# Patient Record
Sex: Male | Born: 1970 | Race: Black or African American | Hispanic: No | Marital: Married | Smoking: Current every day smoker
Health system: Southern US, Community
[De-identification: ages and names within clinical notes are randomized; demographics above are authoritative.]

## PROBLEM LIST (undated history)

## (undated) DIAGNOSIS — C6290 Malignant neoplasm of unspecified testis, unspecified whether descended or undescended: Secondary | ICD-10-CM

## (undated) HISTORY — PX: SURGERY SCROTAL / TESTICULAR: SUR1316

---

## 1998-03-08 ENCOUNTER — Ambulatory Visit (HOSPITAL_COMMUNITY): Admission: RE | Admit: 1998-03-08 | Discharge: 1998-03-08 | Payer: Self-pay | Admitting: Urology

## 2010-02-18 ENCOUNTER — Emergency Department (HOSPITAL_BASED_OUTPATIENT_CLINIC_OR_DEPARTMENT_OTHER): Admission: EM | Admit: 2010-02-18 | Discharge: 2010-02-18 | Payer: Self-pay | Admitting: Emergency Medicine

## 2013-03-17 ENCOUNTER — Emergency Department (HOSPITAL_BASED_OUTPATIENT_CLINIC_OR_DEPARTMENT_OTHER)
Admission: EM | Admit: 2013-03-17 | Discharge: 2013-03-17 | Disposition: A | Discharge status: Home / Self Care | Payer: 59 | Attending: Emergency Medicine | Admitting: Emergency Medicine

## 2013-03-17 ENCOUNTER — Emergency Department (HOSPITAL_BASED_OUTPATIENT_CLINIC_OR_DEPARTMENT_OTHER): Payer: 59

## 2013-03-17 ENCOUNTER — Encounter (HOSPITAL_BASED_OUTPATIENT_CLINIC_OR_DEPARTMENT_OTHER): Payer: Self-pay | Admitting: *Deleted

## 2013-03-17 DIAGNOSIS — R109 Unspecified abdominal pain: Secondary | ICD-10-CM

## 2013-03-17 DIAGNOSIS — Z8547 Personal history of malignant neoplasm of testis: Secondary | ICD-10-CM | POA: Insufficient documentation

## 2013-03-17 DIAGNOSIS — R1032 Left lower quadrant pain: Secondary | ICD-10-CM | POA: Insufficient documentation

## 2013-03-17 DIAGNOSIS — F17200 Nicotine dependence, unspecified, uncomplicated: Secondary | ICD-10-CM | POA: Insufficient documentation

## 2013-03-17 HISTORY — DX: Malignant neoplasm of unspecified testis, unspecified whether descended or undescended: C62.90

## 2013-03-17 LAB — COMPREHENSIVE METABOLIC PANEL
BUN: 10 mg/dL (ref 6–23)
CO2: 26 mEq/L (ref 19–32)
Calcium: 9.8 mg/dL (ref 8.4–10.5)
Creatinine, Ser: 1.2 mg/dL (ref 0.50–1.35)
GFR calc Af Amer: 86 mL/min — ABNORMAL LOW (ref >90)
GFR calc non Af Amer: 74 mL/min — ABNORMAL LOW (ref >90)
Glucose, Bld: 92 mg/dL (ref 70–99)

## 2013-03-17 LAB — CBC WITH DIFFERENTIAL/PLATELET
Eosinophils Relative: 2 % (ref 0–5)
HCT: 43.6 % (ref 39.0–52.0)
Lymphocytes Relative: 23 % (ref 12–46)
Lymphs Abs: 2 10*3/uL (ref 0.7–4.0)
MCV: 92.4 fL (ref 78.0–100.0)
Monocytes Absolute: 0.7 10*3/uL (ref 0.1–1.0)
RBC: 4.72 MIL/uL (ref 4.22–5.81)
RDW: 13.9 % (ref 11.5–15.5)
WBC: 8.5 10*3/uL (ref 4.0–10.5)

## 2013-03-17 LAB — URINALYSIS, ROUTINE W REFLEX MICROSCOPIC
Leukocytes, UA: NEGATIVE
Protein, ur: NEGATIVE mg/dL
Urobilinogen, UA: 0.2 mg/dL (ref 0.0–1.0)

## 2013-03-17 MED ORDER — IOHEXOL 300 MG/ML  SOLN
100.0000 mL | Freq: Once | INTRAMUSCULAR | Status: AC | PRN
  Administered 2013-03-17: 100 mL via INTRAVENOUS

## 2013-03-17 MED ORDER — IOHEXOL 300 MG/ML  SOLN
50.0000 mL | Freq: Once | INTRAMUSCULAR | Status: AC | PRN
  Administered 2013-03-17: 50 mL via INTRAVENOUS

## 2013-03-17 MED ORDER — SODIUM CHLORIDE 0.9 % IV SOLN
1000.0000 mL | Freq: Once | INTRAVENOUS | Status: AC
  Administered 2013-03-17: 1000 mL via INTRAVENOUS

## 2013-03-17 MED ORDER — ONDANSETRON HCL 4 MG/2ML IJ SOLN
4.0000 mg | Freq: Once | INTRAMUSCULAR | Status: AC
  Administered 2013-03-17: 4 mg via INTRAVENOUS
  Filled 2013-03-17: qty 2

## 2013-03-17 MED ORDER — SENNOSIDES-DOCUSATE SODIUM 8.6-50 MG PO TABS: 2.0000 | ORAL_TABLET | Freq: Every day | ORAL | Status: AC

## 2013-03-17 NOTE — ED Notes (Signed)
Pt reports also pains off and on "sharp" to his left lower quadrant.

## 2013-03-17 NOTE — ED Provider Notes (Signed)
History    CSN: [id number] Arrival date & time 03/17/13  4098  First MD Initiated Contact with Patient 03/17/13 [phone]     Chief Complaint  Patient presents with  . Constipation   (Consider location/radiation/quality/duration/timing/severity/associated sxs/prior Treatment) HPI  Patient presents with concern of persistent left lower quadrant, left inguinal pain.  Pain has been present consistently now for 3 days, there was low-grade prior to this time.  The pain is sore, nonradiating.  Patient has concurrent decrease in stool. He has no other abdominal pain, no vomiting, no anorexia. Patient typically has infrequent stool, but this seems more prolonged than typical for him. The patient has no history of testicular cancer. He denies history of recent persistent scrotal pain or changes. For his testicular cancer he had radiation therapy, orchectomy. Patient is otherwise generally well-appearing  Past Medical History  Diagnosis Date  . Testicular cancer    History reviewed. No pertinent past surgical history. History reviewed. No pertinent family history. History  Substance Use Topics  . Smoking status: Current Every Day Smoker  . Smokeless tobacco: Not on file  . Alcohol Use: Not on file    Review of Systems  Constitutional:       Per HPI, otherwise negative  HENT:       Per HPI, otherwise negative  Respiratory:       Per HPI, otherwise negative  Cardiovascular:       Per HPI, otherwise negative  Gastrointestinal: Negative for vomiting.  Endocrine:       Negative aside from HPI  Genitourinary:       Neg aside from HPI   Musculoskeletal:       Per HPI, otherwise negative  Skin: Negative.   Neurological: Negative for syncope.    Allergies  Review of patient's allergies indicates no known allergies.  Home Medications  No current outpatient prescriptions on file. BP 126/85  Pulse 57  Temp(Src) 97.5 F (36.4 C) (Oral)  SpO2 100% Physical Exam  Nursing note  and vitals reviewed. Constitutional: He is oriented to person, place, and time. He appears well-developed. No distress.  HENT:  Head: Normocephalic and atraumatic.  Eyes: Conjunctivae and EOM are normal.  Cardiovascular: Normal rate and regular rhythm.   Pulmonary/Chest: Effort normal. No stridor. No respiratory distress.  Abdominal: Normal appearance and bowel sounds are normal. He exhibits no distension. There is tenderness in the left lower quadrant. There is no rigidity and no guarding.  Genitourinary: Penis normal.  Musculoskeletal: He exhibits no edema.  Lymphadenopathy:       Left: Inguinal adenopathy present.  Neurological: He is alert and oriented to person, place, and time.  Skin: Skin is warm and dry.  Psychiatric: He has a normal mood and affect.    ED Course  Procedures (including critical care time) Labs Reviewed  CBC WITH DIFFERENTIAL  COMPREHENSIVE METABOLIC PANEL  LIPASE, BLOOD  URINALYSIS, ROUTINE W REFLEX MICROSCOPIC   No results found. No diagnosis found. Pulse oximetry 100% room air normal   CT results are reassuring.  Results shared with the patient.  No new complaints MDM  This patient with notable history of testicular cancer now presents with left inguinal pain and on exam has palpable adenopathy, and pain.  With the history, his new pain, CT scan was performed to rule out malignancy versus obstruction with his endorsement of bowel habit changes.  CT was reassuring.  Patient was started on stool softeners, discharged to follow up with primary care.  Gerhard Munch,  MD 03/17/13 1213

## 2013-03-17 NOTE — ED Notes (Signed)
Pt amb to room 9 with quick steady gait in nad. Pt reports intermittent constipation x 3 weeks, pt reports last bm "small" was Sunday. Pt has not taken any laxatives or stool softeners.

## 2014-08-11 IMAGING — CT CT ABD-PELV W/ CM
2 of 8 series · 12 of 46 positions shown, 14 images · IV contrast (APPLIED)
Comparison: None.

CLINICAL DATA: Left lower quadrant pain, history of testicular
carcinoma

CT ABDOMEN AND PELVIS WITH CONTRAST
TECHNIQUE: Multidetector CT imaging of the abdomen and pelvis was
performed following the standard protocol during bolus
administration of intravenous contrast.
Contrast: 50mL OMNIPAQUE IOHEXOL 300 MG/ML  SOLN, 100mL OMNIPAQUE
IOHEXOL 300 MG/ML  SOLN

[Series 2: abd/pelvis 5.0 b31f · axial · 0.81mm/px · z∈[+832,+1276]mm · 9 of 101 slices shown, 11 images]
[im 6/101  soft-tissue]
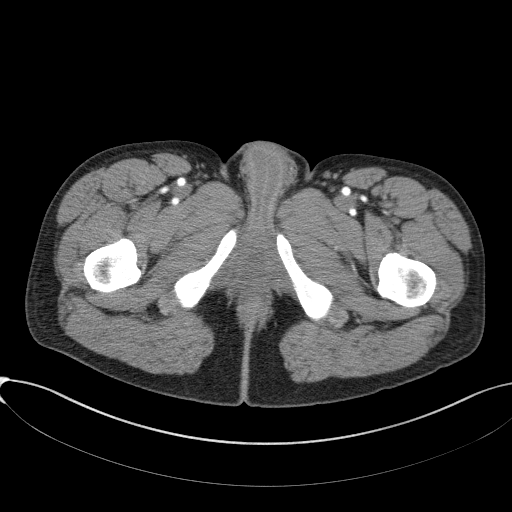
[im 6/101  bone]
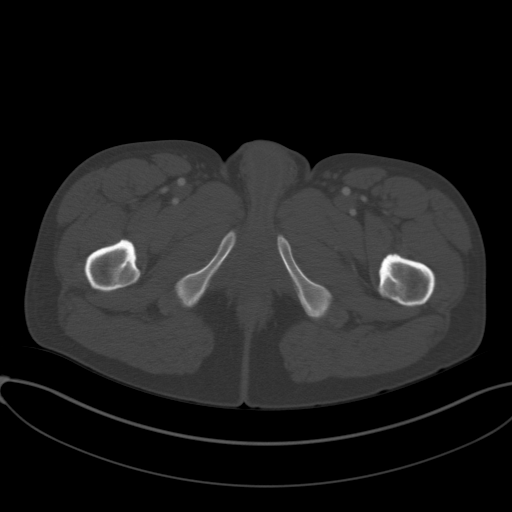
[im 18/101  soft-tissue]
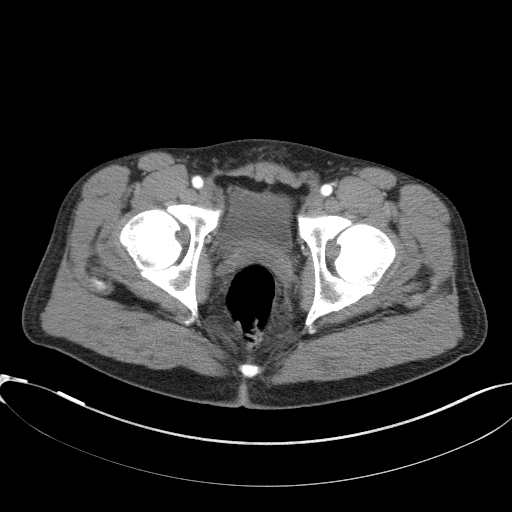
[im 30/101  soft-tissue]
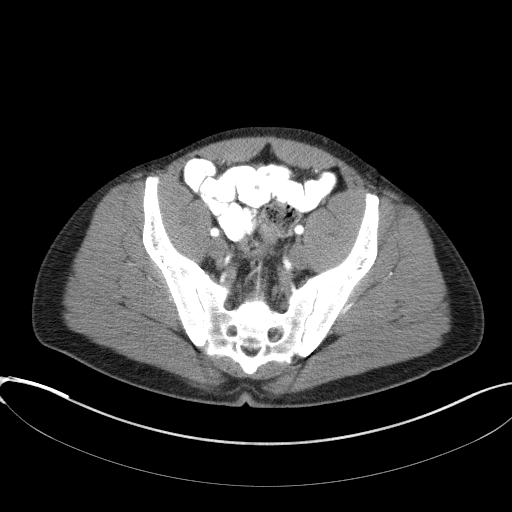
[im 42/101  soft-tissue]
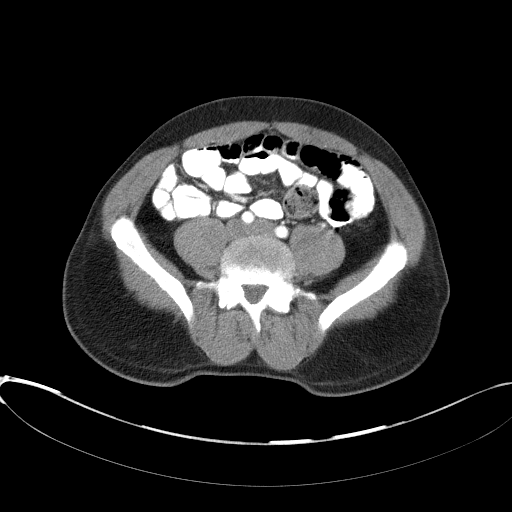
[im 53/101  soft-tissue]
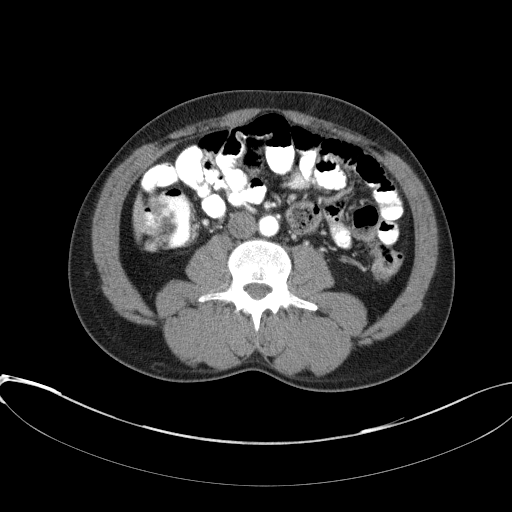
[im 59/101  soft-tissue]
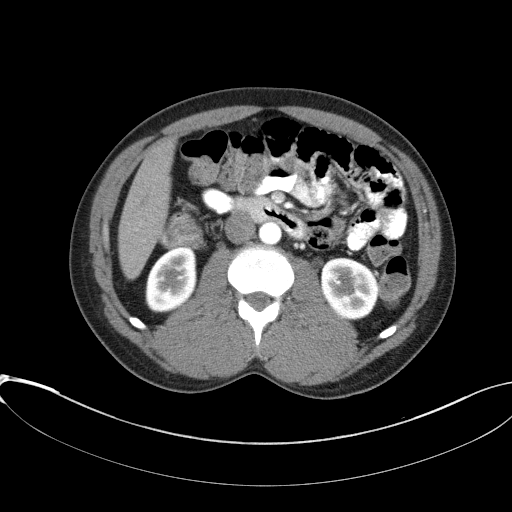
[im 71/101  soft-tissue]
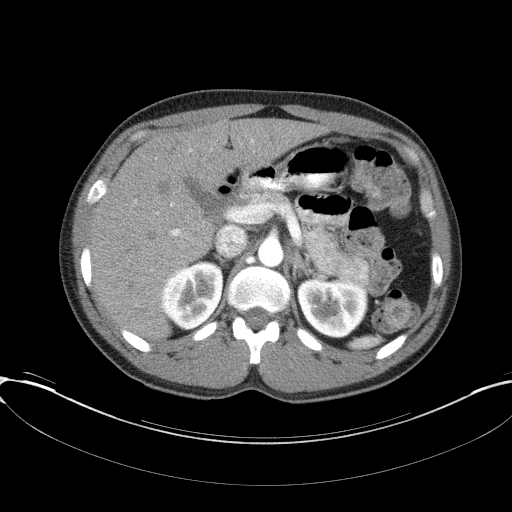
[im 83/101  soft-tissue]
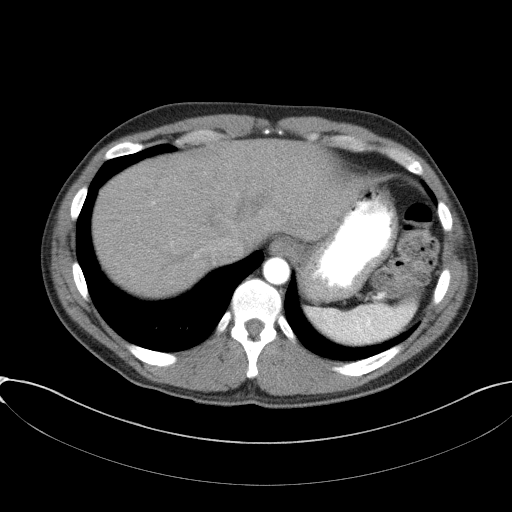
[im 95/101  soft-tissue]
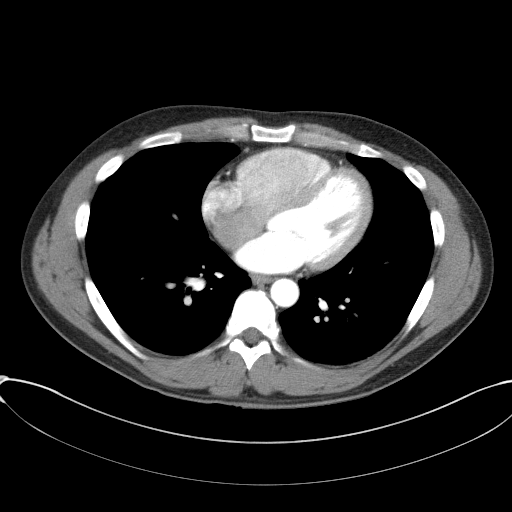
[im 95/101  bone]
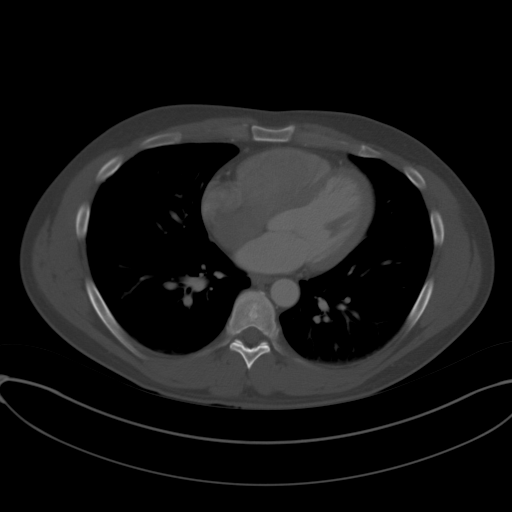

[Series 9: abd/pelvis 3.0 coronal · coronal · 0.34mm/px · 3 of 72 slices shown]
[im 18/72  soft-tissue]
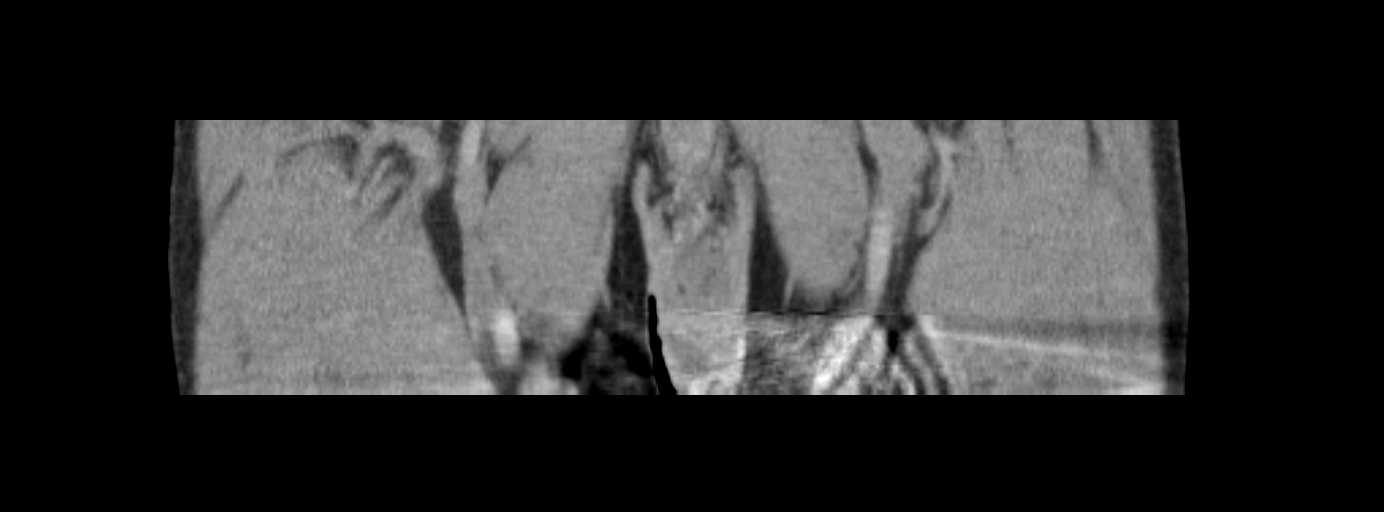
[im 36/72  soft-tissue]
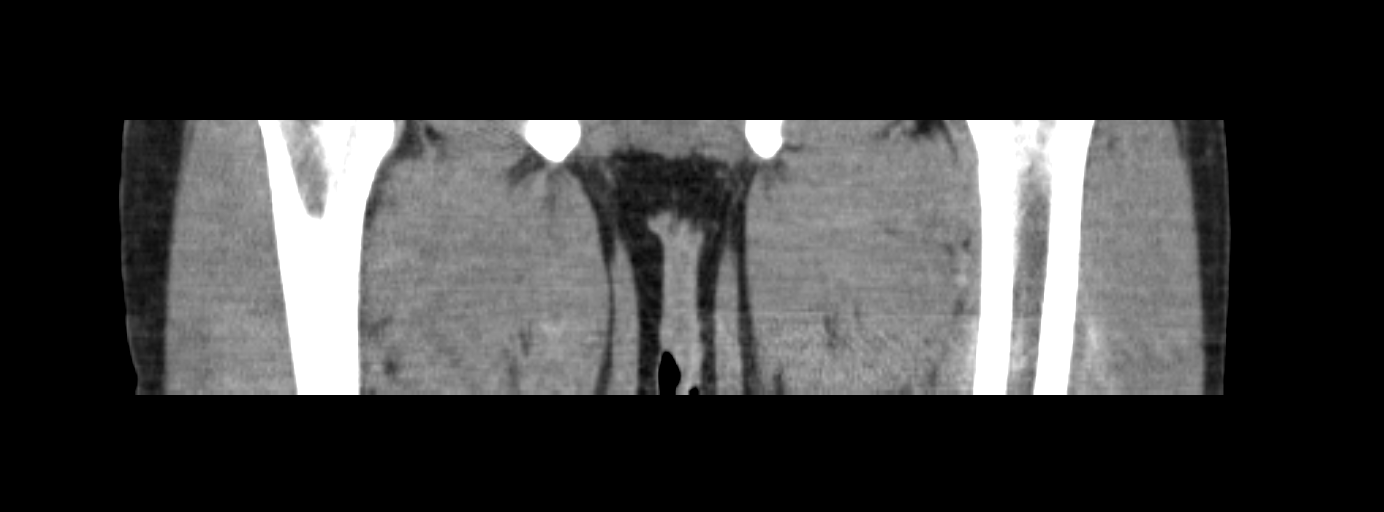
[im 54/72  soft-tissue]
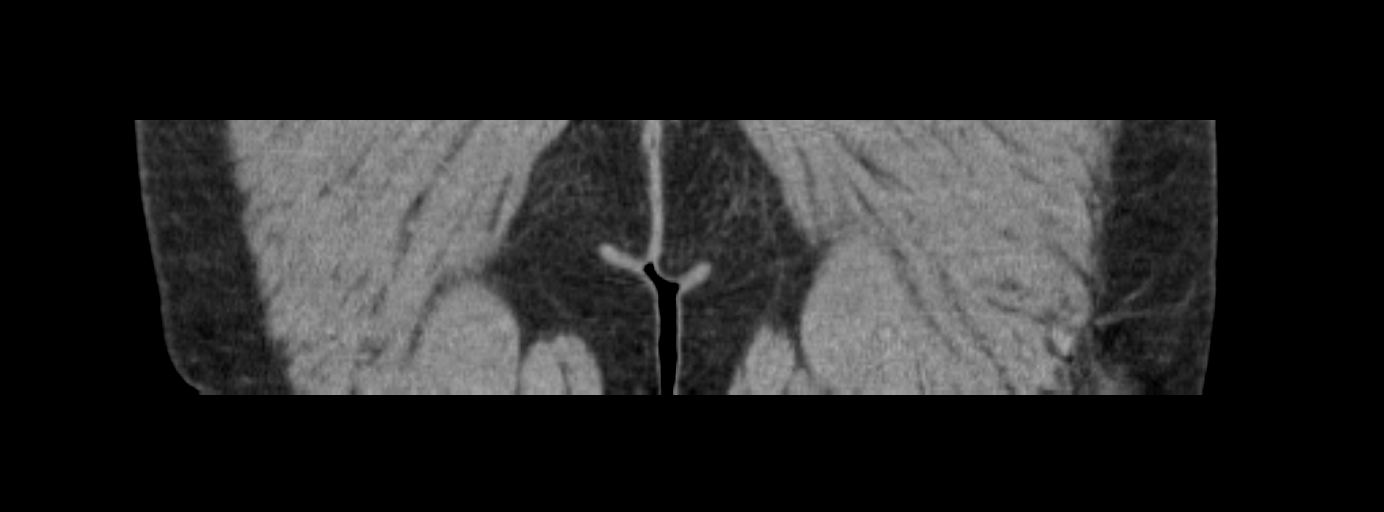

[12 of 46 positions shown; findings below may reference images not displayed]

FINDINGS: Lung bases are well aerated without evidence of focal
infiltrate.  A few small nodular changes are noted in the right
lung base.  They measure less than 5 mm in dimension.

The liver, gallbladder, spleen, adrenal glands and pancreas are all
normal on the CT appearance.

The kidneys demonstrate normal enhancement pattern.  Normal
excretion of contrast is noted bilaterally.

Scanning was then continued in the pelvis which demonstrates a
bladder to be partially distended without opacified urine.  The
appendix is not well visualized although no inflammatory changes
are seen.  The large and small bowel as visualized are within
normal limits.

A few scattered inguinal lymph nodes are noted although these are
not significant by size criteria.  No pelvic or retroperitoneal
adenopathy is seen.  No bony abnormality is noted.
IMPRESSION: No findings to account for the patient's underlying pain.

No evidence of significant lymphadenopathy.

## 2019-02-17 ENCOUNTER — Encounter (HOSPITAL_BASED_OUTPATIENT_CLINIC_OR_DEPARTMENT_OTHER): Payer: Self-pay | Admitting: *Deleted

## 2019-02-17 ENCOUNTER — Other Ambulatory Visit: Payer: Self-pay

## 2019-02-17 ENCOUNTER — Emergency Department (HOSPITAL_BASED_OUTPATIENT_CLINIC_OR_DEPARTMENT_OTHER)
Admission: EM | Admit: 2019-02-17 | Discharge: 2019-02-17 | Disposition: A | Discharge status: Home / Self Care | Payer: Non-veteran care | Attending: Emergency Medicine | Admitting: Emergency Medicine

## 2019-02-17 DIAGNOSIS — R3121 Asymptomatic microscopic hematuria: Secondary | ICD-10-CM | POA: Insufficient documentation

## 2019-02-17 DIAGNOSIS — F17200 Nicotine dependence, unspecified, uncomplicated: Secondary | ICD-10-CM | POA: Insufficient documentation

## 2019-02-17 DIAGNOSIS — R1031 Right lower quadrant pain: Secondary | ICD-10-CM | POA: Diagnosis not present

## 2019-02-17 DIAGNOSIS — R309 Painful micturition, unspecified: Secondary | ICD-10-CM | POA: Diagnosis not present

## 2019-02-17 DIAGNOSIS — R319 Hematuria, unspecified: Secondary | ICD-10-CM | POA: Diagnosis present

## 2019-02-17 DIAGNOSIS — Z8547 Personal history of malignant neoplasm of testis: Secondary | ICD-10-CM | POA: Insufficient documentation

## 2019-02-17 LAB — URINALYSIS, ROUTINE W REFLEX MICROSCOPIC
Bilirubin Urine: NEGATIVE
Glucose, UA: NEGATIVE mg/dL
Ketones, ur: NEGATIVE mg/dL
Leukocytes,Ua: NEGATIVE
Nitrite: NEGATIVE
Protein, ur: NEGATIVE mg/dL
Specific Gravity, Urine: 1.015 (ref 1.005–1.030)
pH: 6 (ref 5.0–8.0)

## 2019-02-17 LAB — URINALYSIS, MICROSCOPIC (REFLEX): Bacteria, UA: NONE SEEN

## 2019-02-17 NOTE — ED Notes (Signed)
ED Provider at bedside. 

## 2019-02-17 NOTE — ED Triage Notes (Signed)
Hematuria yesterday. States he thinks he may have hurt his urethra when pulling his underwear down.

## 2019-02-17 NOTE — Discharge Instructions (Signed)
You were seen in the emergency department today with blood in the urine.  Is possible that yesterday he passed a small kidney stone or blood clot.  There is a trace amount of blood in the urine today.  Symptoms seem to be improving.  Please follow-up with the urologist as indicated.  Return to the emergency department any new or worsening symptoms.

## 2019-02-17 NOTE — ED Provider Notes (Signed)
Emergency Department Provider Note   I have reviewed the triage vital signs and the nursing notes.   HISTORY  Chief Complaint Hematuria   HPI Jeffrey Freeman is a 48 y.o. male presents to the ED with blood in the urine yesterday with some pain. Pain has resolved and blood in the urine appears to have resolved. Had some mild RLQ pain yesterday that resolved. He went to urinate yesterday and had trouble doing so. He had a sudden rush of urine with pain and blood noted at that time. Pain lingered for several minutes and then resoled. No longer having abdominal pain. No history of hematuria or kidney stone. No back/flank pain. No fever/chills.   Past Medical History:  Diagnosis Date  . Testicular cancer ([place])     There are no active problems to display for this patient.   History reviewed. No pertinent surgical history.  Allergies Patient has no known allergies.  No family history on file.  Social History Social History   Tobacco Use  . Smoking status: Current Every Day Smoker  . Smokeless tobacco: Never Used  Substance Use Topics  . Alcohol use: Yes  . Drug use: Never    Review of Systems  Constitutional: No fever/chills Eyes: No visual changes. ENT: No sore throat. Cardiovascular: Denies chest pain. Respiratory: Denies shortness of breath. Gastrointestinal: No abdominal pain.  No nausea, no vomiting.  No diarrhea.  No constipation. Genitourinary: Momentary dysuria and hematuria.  Musculoskeletal: Negative for back pain. Skin: Negative for rash. Neurological: Negative for headaches, focal weakness or numbness.  10-point ROS otherwise negative.  ____________________________________________   PHYSICAL EXAM:  VITAL SIGNS: ED Triage Vitals  Enc Vitals Group     BP 02/17/19 1145 130/86     Pulse Rate 02/17/19 1145 63     Resp 02/17/19 1145 20     Temp 02/17/19 1145 98.2 F (36.8 C)     Temp Source 02/17/19 1145 Oral     SpO2 02/17/19 1145 99 %   Weight 02/17/19 1142 185 lb 8 oz (84.1 kg)     Height 02/17/19 1142 6' (1.829 m)    Constitutional: Alert and oriented. Well appearing and in no acute distress. Eyes: Conjunctivae are normal Head: Atraumatic. Nose: No congestion/rhinnorhea. Mouth/Throat: Mucous membranes are moist.  Neck: No stridor.   Cardiovascular: Normal rate, regular rhythm. Good peripheral circulation. Grossly normal heart sounds.   Respiratory: Normal respiratory effort.  No retractions. Lungs CTAB. Gastrointestinal: Soft and nontender. No distention. No CVA tenderness.  Musculoskeletal: No lower extremity tenderness nor edema. No gross deformities of extremities. Neurologic:  Normal speech and language.  Skin:  Skin is warm, dry and intact. No rash noted.   ____________________________________________   LABS (all labs ordered are listed, but only abnormal results are displayed)  Labs Reviewed  URINALYSIS, ROUTINE W REFLEX MICROSCOPIC - Abnormal; Notable for the following components:      Result Value   Hgb urine dipstick TRACE (*)    All other components within normal limits  URINALYSIS, MICROSCOPIC (REFLEX)   ____________________________________________   PROCEDURES  Procedure(s) performed:   Procedures  None ____________________________________________   INITIAL IMPRESSION / ASSESSMENT AND PLAN / ED COURSE  Pertinent labs & imaging results that were available during my care of the patient were reviewed by me and considered in my medical decision making (see chart for details).   Trace Hb on UA. No evidence of infection. No active pain. Question passing small kidney stone yesterday. Plan for monitoring  at home and Urology follow up for hematuria. Discussed ED return precautions.    ____________________________________________  FINAL CLINICAL IMPRESSION(S) / ED DIAGNOSES  Final diagnoses:  Asymptomatic microscopic hematuria    Note:  This document was prepared using Dragon voice  recognition software and may include unintentional dictation errors.  Nanda Quinton, MD Emergency Medicine    Saory Carriero, [facility], MD 02/19/19 [id number]

## 2019-10-06 ENCOUNTER — Ambulatory Visit: Payer: No Typology Code available for payment source | Attending: Internal Medicine

## 2019-10-06 DIAGNOSIS — Z20822 Contact with and (suspected) exposure to covid-19: Secondary | ICD-10-CM

## 2019-10-07 LAB — NOVEL CORONAVIRUS, NAA: SARS-CoV-2, NAA: NOT DETECTED

## 2020-12-10 ENCOUNTER — Encounter (HOSPITAL_BASED_OUTPATIENT_CLINIC_OR_DEPARTMENT_OTHER): Payer: Self-pay | Admitting: *Deleted

## 2020-12-10 ENCOUNTER — Other Ambulatory Visit: Payer: Self-pay

## 2020-12-10 ENCOUNTER — Emergency Department (HOSPITAL_BASED_OUTPATIENT_CLINIC_OR_DEPARTMENT_OTHER): Payer: No Typology Code available for payment source

## 2020-12-10 ENCOUNTER — Emergency Department (HOSPITAL_BASED_OUTPATIENT_CLINIC_OR_DEPARTMENT_OTHER)
Admission: EM | Admit: 2020-12-10 | Discharge: 2020-12-10 | Disposition: A | Discharge status: Home / Self Care | Payer: No Typology Code available for payment source | Attending: Emergency Medicine | Admitting: Emergency Medicine

## 2020-12-10 DIAGNOSIS — M546 Pain in thoracic spine: Secondary | ICD-10-CM | POA: Diagnosis not present

## 2020-12-10 DIAGNOSIS — M25512 Pain in left shoulder: Secondary | ICD-10-CM | POA: Diagnosis not present

## 2020-12-10 DIAGNOSIS — Z8547 Personal history of malignant neoplasm of testis: Secondary | ICD-10-CM | POA: Insufficient documentation

## 2020-12-10 DIAGNOSIS — W010XXA Fall on same level from slipping, tripping and stumbling without subsequent striking against object, initial encounter: Secondary | ICD-10-CM | POA: Diagnosis not present

## 2020-12-10 DIAGNOSIS — F17200 Nicotine dependence, unspecified, uncomplicated: Secondary | ICD-10-CM | POA: Insufficient documentation

## 2020-12-10 DIAGNOSIS — Y9301 Activity, walking, marching and hiking: Secondary | ICD-10-CM | POA: Insufficient documentation

## 2020-12-10 DIAGNOSIS — W19XXXA Unspecified fall, initial encounter: Secondary | ICD-10-CM

## 2020-12-10 MED ORDER — LIDOCAINE 5 % EX PTCH
1.0000 | MEDICATED_PATCH | Freq: Once | CUTANEOUS | Status: DC
  Administered 2020-12-10: 1 via TRANSDERMAL
  Filled 2020-12-10: qty 1

## 2020-12-10 MED ORDER — KETOROLAC TROMETHAMINE 30 MG/ML IJ SOLN
30.0000 mg | Freq: Once | INTRAMUSCULAR | Status: AC
  Administered 2020-12-10: 30 mg via INTRAMUSCULAR
  Filled 2020-12-10: qty 1

## 2020-12-10 NOTE — Discharge Instructions (Signed)
Your x-ray showed no evidence of fracture, shoulder fracture or dislocation.  Likely muscle strain from fall.  Continue to treat with Aleve as well as Tylenol 1000 mg every 6 hours.  You can also apply over-the-counter Salonpas lidocaine patches for 12 hours, then remove for 12 hours to give your skin a break.  You can also apply ice and heat.  Follow-up with your regular doctor if this pain is not improving.

## 2020-12-10 NOTE — ED Triage Notes (Signed)
He was walking up steps and fell 2 days ago. Pain in his left scapula since the fall.

## 2020-12-10 NOTE — ED Provider Notes (Signed)
[facility] EMERGENCY DEPARTMENT Provider Note   CSN: [id number] Arrival date & time: 12/10/20  1326     History Chief Complaint  Patient presents with  . Back Pain    Jeffrey Freeman is a 50 y.o. male.  Jeffrey Freeman is a 51 y.o. male with a history of testicular cancer, reports that Saturday he was walking up steps and tripped falling forward and catching himself on his left outstretched arm.  He reports he did not fall onto the arm or hit his back but since the fall is having persistent pain in his left upper back just underneath his shoulder blade extending laterally.  He has persistent dull ache that is worse with certain activities and movements.  He reports this pain is worse with certain movements and sometimes with a very deep breath.  He denies any anterior chest pain.  No cough.  No head injury or other injuries from the fall.  No prior shoulder injury or surgery.  Has taken some Aleve with minimal relief prior to arrival, no other aggravating or alleviating factors.        Past Medical History:  Diagnosis Date  . Testicular cancer ([place])     There are no problems to display for this patient.   Past Surgical History:  Procedure Laterality Date  . SURGERY SCROTAL / TESTICULAR         No family history on file.  Social History   Tobacco Use  . Smoking status: Current Every Day Smoker  . Smokeless tobacco: Never Used  Substance Use Topics  . Alcohol use: Yes  . Drug use: Never    Home Medications Prior to Admission medications   Medication Sig Start Date End Date Taking? Authorizing Provider  senna-docusate (SENOKOT-S) 8.6-50 MG per tablet Take 2 tablets by mouth daily. 03/17/13   Carmin Muskrat, MD    Allergies    Patient has no known allergies.  Review of Systems   Review of Systems  Constitutional: Negative for chills and fever.  Respiratory: Negative for cough and shortness of breath.   Cardiovascular: Negative for chest pain.   Genitourinary: Negative for flank pain.  Musculoskeletal: Positive for arthralgias and back pain. Negative for myalgias and neck pain.  Skin: Negative for color change and rash.  Neurological: Negative for headaches.  All other systems reviewed and are negative.   Physical Exam Updated Vital Signs BP 124/85 (BP Location: Right Arm)   Pulse (!) 59   Temp 97.7 F (36.5 C) (Oral)   Resp 16   Ht 6' (1.829 m)   Wt 89.8 kg   SpO2 100%   BMI 26.84 kg/m   Physical Exam Vitals and nursing note reviewed.  Constitutional:      General: He is not in acute distress.    Appearance: Normal appearance. He is well-developed and normal weight. He is not ill-appearing or diaphoretic.  HENT:     Head: Normocephalic and atraumatic.     Comments: No evident head trauma.    Mouth/Throat:     Mouth: Mucous membranes are moist.     Pharynx: Oropharynx is clear.  Eyes:     General:        Right eye: No discharge.        Left eye: No discharge.  Pulmonary:     Effort: Pulmonary effort is normal. No respiratory distress.  Musculoskeletal:     Comments: Tenderness present just below the left scapula and extending to the lateral  lower ribs without palpable deformity, crepitus or overlying ecchymosis.  No midline spinal tenderness All other joints are supple and easily movable, all compartments soft  Skin:    General: Skin is warm and dry.  Neurological:     Mental Status: He is alert and oriented to person, place, and time.     Coordination: Coordination normal.  Psychiatric:        Mood and Affect: Mood normal.        Behavior: Behavior normal.     ED Results / Procedures / Treatments   Labs (all labs ordered are listed, but only abnormal results are displayed) Labs Reviewed - No data to display  EKG None  Radiology DG Ribs Unilateral W/Chest Left  Result Date: 12/10/2020 CLINICAL DATA:  Fall, left posterior rib pain EXAM: LEFT RIBS AND CHEST - 3+ VIEW COMPARISON:  None. FINDINGS:  Lungs are clear.  No pleural effusion or pneumothorax. The heart is normal in size. No displaced left rib fracture is seen. IMPRESSION: No evidence of acute cardiopulmonary disease. No displaced left rib fracture is seen. Electronically Signed   By: Julian Hy M.D.   On: 12/10/2020 15:02   DG Shoulder Left  Result Date: 12/10/2020 CLINICAL DATA:  Fall, left shoulder pain EXAM: LEFT SHOULDER - 2+ VIEW COMPARISON:  None. FINDINGS: No fracture or dislocation is seen. The joint spaces are preserved. Visualized soft tissues are within normal limits. IMPRESSION: Negative. Electronically Signed   By: Julian Hy M.D.   On: 12/10/2020 15:02    Procedures Procedures   Medications Ordered in ED Medications  lidocaine (LIDODERM) 5 % 1 patch (1 patch Transdermal Patch Applied 12/10/20 1458)  ketorolac (TORADOL) 30 MG/ML injection 30 mg (30 mg Intramuscular Given 12/10/20 1456)    ED Course  I have reviewed the triage vital signs and the nursing notes.  Pertinent labs & imaging results that were available during my care of the patient were reviewed by me and considered in my medical decision making (see chart for details).    MDM Rules/Calculators/A&P                         50 year old male presents with fall 2 days ago catching himself on his outstretched left arm, since then has been having pain in the shoulder and left upper back.  Some pain with deep breath.  On exam tenderness just below the scapula and over the left lateral ribs without crepitus, deformity or overlying ecchymosis.  No head injury or other injuries from fall.  Will get x-rays of the left ribs and left shoulder.  X-rays personally reviewed by myself, agree with radiologist finding.  No obvious rib fracture or other acute abnormalities noted and no fracture or dislocation of the left shoulder.  Pain improved with symptomatic treatment here in the ED with Toradol and Lidoderm patch.  Will have patient continue supportive  treatment at home, suspect muscle strain, in particular of the teres muscles from catching himself during fall.  Discussed return precautions and outpatient follow-up.  Patient expresses understanding and agreement.  Discharged home in good condition.  Final Clinical Impression(s) / ED Diagnoses Final diagnoses:  Acute left-sided thoracic back pain  Fall, initial encounter    Rx / DC Orders ED Discharge Orders    None       Janet Berlin 12/10/20 1719    Arnaldo Natal, MD 12/11/20 [phone]

## 2021-09-04 ENCOUNTER — Encounter (HOSPITAL_BASED_OUTPATIENT_CLINIC_OR_DEPARTMENT_OTHER): Payer: Self-pay

## 2021-09-04 ENCOUNTER — Other Ambulatory Visit: Payer: Self-pay

## 2021-09-04 ENCOUNTER — Emergency Department (HOSPITAL_BASED_OUTPATIENT_CLINIC_OR_DEPARTMENT_OTHER): Payer: No Typology Code available for payment source

## 2021-09-04 ENCOUNTER — Emergency Department (HOSPITAL_BASED_OUTPATIENT_CLINIC_OR_DEPARTMENT_OTHER)
Admission: EM | Admit: 2021-09-04 | Discharge: 2021-09-04 | Disposition: A | Discharge status: Home / Self Care | Payer: No Typology Code available for payment source | Attending: Emergency Medicine | Admitting: Emergency Medicine

## 2021-09-04 DIAGNOSIS — R0789 Other chest pain: Secondary | ICD-10-CM | POA: Diagnosis present

## 2021-09-04 LAB — CBC WITH DIFFERENTIAL/PLATELET
Abs Immature Granulocytes: 0.03 10*3/uL (ref 0.00–0.07)
Basophils Absolute: 0.1 10*3/uL (ref 0.0–0.1)
Basophils Relative: 1 %
Eosinophils Absolute: 0.1 10*3/uL (ref 0.0–0.5)
Eosinophils Relative: 2 %
HCT: 46 % (ref 39.0–52.0)
Hemoglobin: 15.6 g/dL (ref 13.0–17.0)
Immature Granulocytes: 0 %
Lymphocytes Relative: 29 %
Lymphs Abs: 2.1 10*3/uL (ref 0.7–4.0)
MCH: 33.1 pg (ref 26.0–34.0)
MCHC: 33.9 g/dL (ref 30.0–36.0)
MCV: 97.7 fL (ref 80.0–100.0)
Monocytes Absolute: 0.7 10*3/uL (ref 0.1–1.0)
Monocytes Relative: 9 %
Neutro Abs: 4.2 10*3/uL (ref 1.7–7.7)
Neutrophils Relative %: 59 %
Platelets: 146 10*3/uL — ABNORMAL LOW (ref 150–400)
RBC: 4.71 MIL/uL (ref 4.22–5.81)
RDW: 14.5 % (ref 11.5–15.5)
WBC: 7.1 10*3/uL (ref 4.0–10.5)
nRBC: 0 % (ref 0.0–0.2)

## 2021-09-04 LAB — BASIC METABOLIC PANEL
Anion gap: 7 (ref 5–15)
BUN: 11 mg/dL (ref 6–20)
CO2: 25 mmol/L (ref 22–32)
Calcium: 8.8 mg/dL — ABNORMAL LOW (ref 8.9–10.3)
Chloride: 105 mmol/L (ref 98–111)
Creatinine, Ser: 1.31 mg/dL — ABNORMAL HIGH (ref 0.61–1.24)
GFR, Estimated: >60 mL/min (ref >60)
Glucose, Bld: 91 mg/dL (ref 70–99)
Potassium: 4.5 mmol/L (ref 3.5–5.1)
Sodium: 137 mmol/L (ref 135–145)

## 2021-09-04 LAB — TROPONIN I (HIGH SENSITIVITY): Troponin I (High Sensitivity): 3 ng/L (ref <18)

## 2021-09-04 MED ORDER — NAPROXEN 500 MG PO TABS: 500.0000 mg | ORAL_TABLET | Freq: Two times a day (BID) | ORAL | 0 refills | Status: AC

## 2021-09-04 NOTE — ED Triage Notes (Signed)
Pt c/o left side CP x 2 days-denies fever/flu sx-NAD-steady gait

## 2021-09-04 NOTE — ED Notes (Signed)
Pt transported to xray 

## 2021-09-04 NOTE — ED Provider Notes (Signed)
[facility] EMERGENCY DEPARTMENT Provider Note    CSN: [id number] Arrival date & time: 09/04/21 1156  History Chief Complaint  Patient presents with   Chest Pain    KENSINGTON DUERST is a 51 y.o. male reports 2 days of chest pain, started during the night with mid chest discomfort and has now moved to L chest. He thought it was a gas pain but came to be checked out at the urging of his ex-wife. He denies any cough or fever. No SOB. Pain improved some today. He states he wore a heart monitor for palpitations while ago that showed 'a mild blockage' which I assume was a conduction delay based on his description of wearing a holter or ZIO monitor. He did not have a heart cath or stent.    Home Medications Prior to Admission medications   Medication Sig Start Date End Date Taking? Authorizing Provider  naproxen (NAPROSYN) 500 MG tablet Take 1 tablet (500 mg total) by mouth 2 (two) times daily. 09/04/21  Yes Truddie Hidden, MD  senna-docusate (SENOKOT-S) 8.6-50 MG per tablet Take 2 tablets by mouth daily. 03/17/13   Carmin Muskrat, MD     Allergies    Patient has no known allergies.   Review of Systems   Review of Systems Please see HPI for pertinent positives and negatives  Physical Exam BP 138/81    Pulse (!) 48    Temp 98 F (36.7 C) (Oral)    Resp 18    Ht 6' (1.829 m)    Wt 87.1 kg    SpO2 100%    BMI 26.04 kg/m   Physical Exam Vitals and nursing note reviewed.  Constitutional:      Appearance: Normal appearance.  HENT:     Head: Normocephalic and atraumatic.     Nose: Nose normal.     Mouth/Throat:     Mouth: Mucous membranes are moist.  Eyes:     Extraocular Movements: Extraocular movements intact.     Conjunctiva/sclera: Conjunctivae normal.  Cardiovascular:     Rate and Rhythm: Normal rate.  Pulmonary:     Effort: Pulmonary effort is normal.     Breath sounds: Normal breath sounds.  Chest:     Chest wall: No tenderness.  Abdominal:      General: Abdomen is flat.     Palpations: Abdomen is soft.     Tenderness: There is no abdominal tenderness.  Musculoskeletal:        General: No swelling. Normal range of motion.     Cervical back: Neck supple.     Right lower leg: No edema.     Left lower leg: No edema.  Skin:    General: Skin is warm and dry.  Neurological:     General: No focal deficit present.     Mental Status: He is alert.  Psychiatric:        Mood and Affect: Mood normal.    ED Results / Procedures / Treatments   EKG EKG Interpretation  Date/Time:  Wednesday September 04 2021 12:05:36 EST Ventricular Rate:  52 PR Interval:  141 QRS Duration: 103 QT Interval:  438 QTC Calculation: 408 R Axis:   -22 Text Interpretation: Sinus rhythm Borderline left axis deviation No old tracing to compare Confirmed by Calvert Cantor [phone]) on 09/04/2021 12:15:04 PM  Procedures Procedures  Medications Ordered in the ED Medications - No data to display  Initial Impression and Plan  Patient here with atypical chest  pain ongoing for >24 hours. Will check labs, including single trop, CXR and reassess. EKG without any concerning findings.   ED Course   Clinical Course as of 09/04/21 1340  Wed Sep 04, 2021  1258 CBC is normal.  [CS]  1258 CXR is clear.  [CS]  1309 BMP with mildly elevated Cr, similar to previous.  [CS]  32 Trop is neg.  [CS]  2549 Patient still resting comfortably, ED workup is unremarkable. Recommend he take NSAIDs for his discomfort, follow up with PCP. RTED for further evaluation.  [CS]    Clinical Course User Index [CS] Truddie Hidden, MD     MDM Rules/Calculators/A&P Medical Decision Making Problems Addressed: Atypical chest pain: acute illness or injury  Amount and/or Complexity of Data Reviewed Labs: ordered. Decision-making details documented in ED Course. Radiology: ordered and independent interpretation performed. Decision-making details documented in ED  Course. ECG/medicine tests: ordered and independent interpretation performed. Decision-making details documented in ED Course.  Risk Prescription drug management.    Final Clinical Impression(s) / ED Diagnoses Final diagnoses:  Atypical chest pain    Rx / DC Orders ED Discharge Orders          Ordered    naproxen (NAPROSYN) 500 MG tablet  2 times daily        09/04/21 1340             Truddie Hidden, MD 09/04/21 1340

## 2022-05-06 IMAGING — DX DG RIBS W/ CHEST 3+V*L*
3 series · 3 of 3 positions shown · non-contrast
Comparison: None.

CLINICAL DATA: Fall, left posterior rib pain

EXAM:
LEFT RIBS AND CHEST - 3+ VIEW

[chest pa]
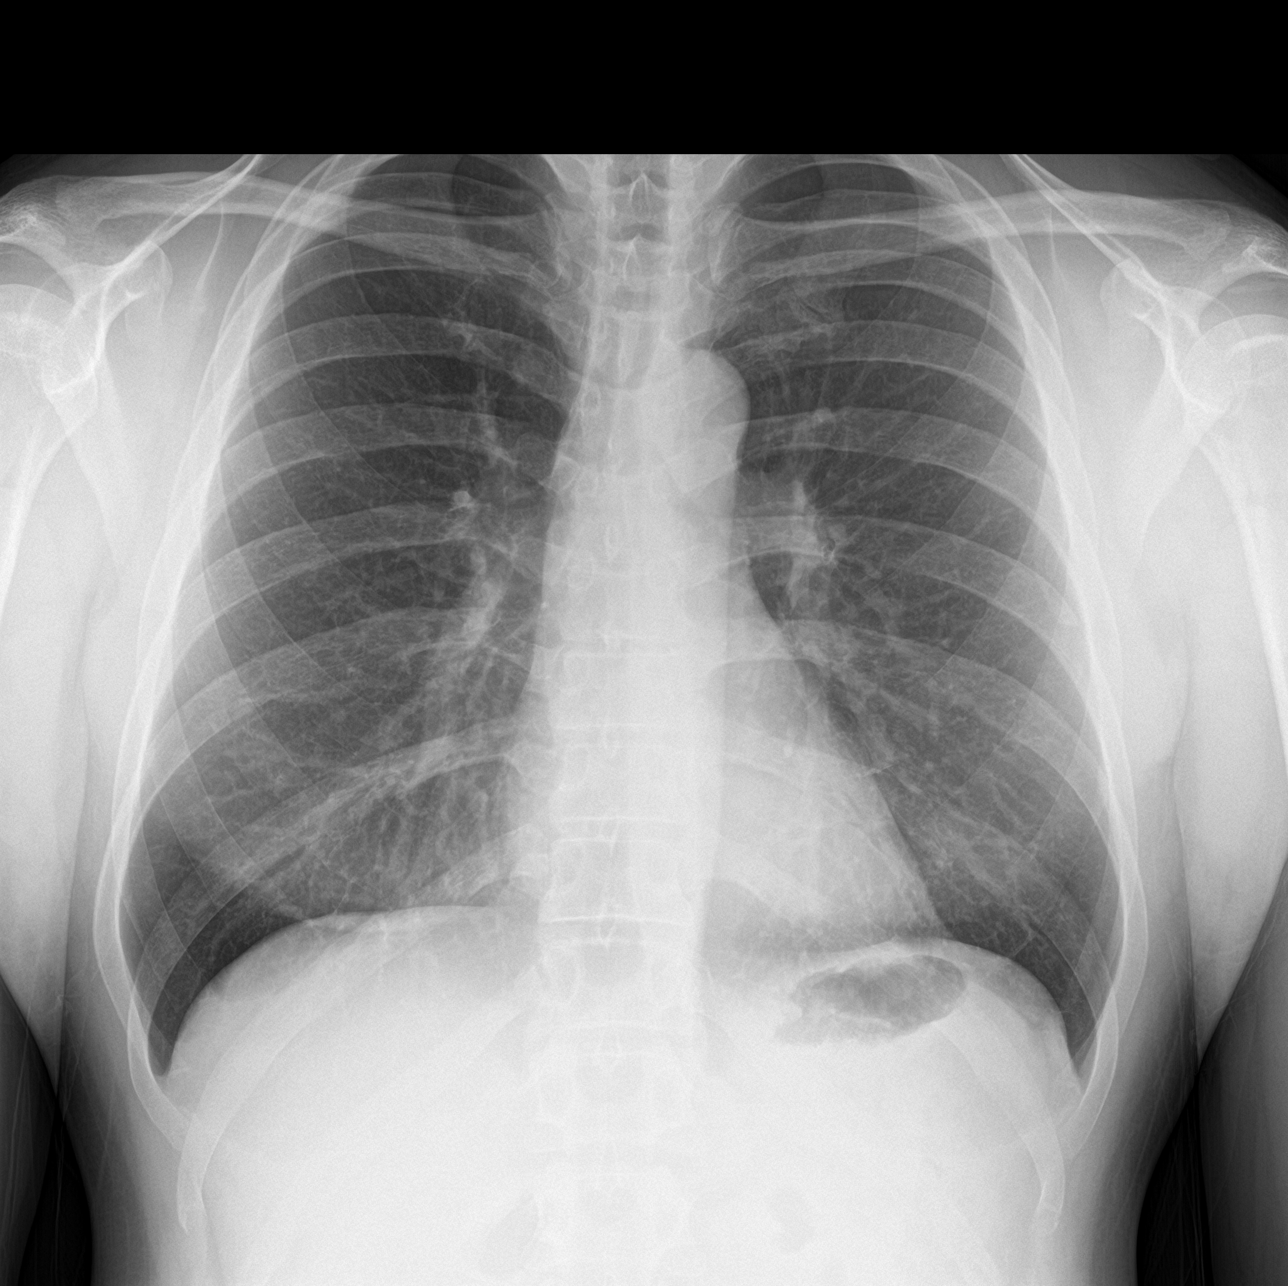

[rib ap]
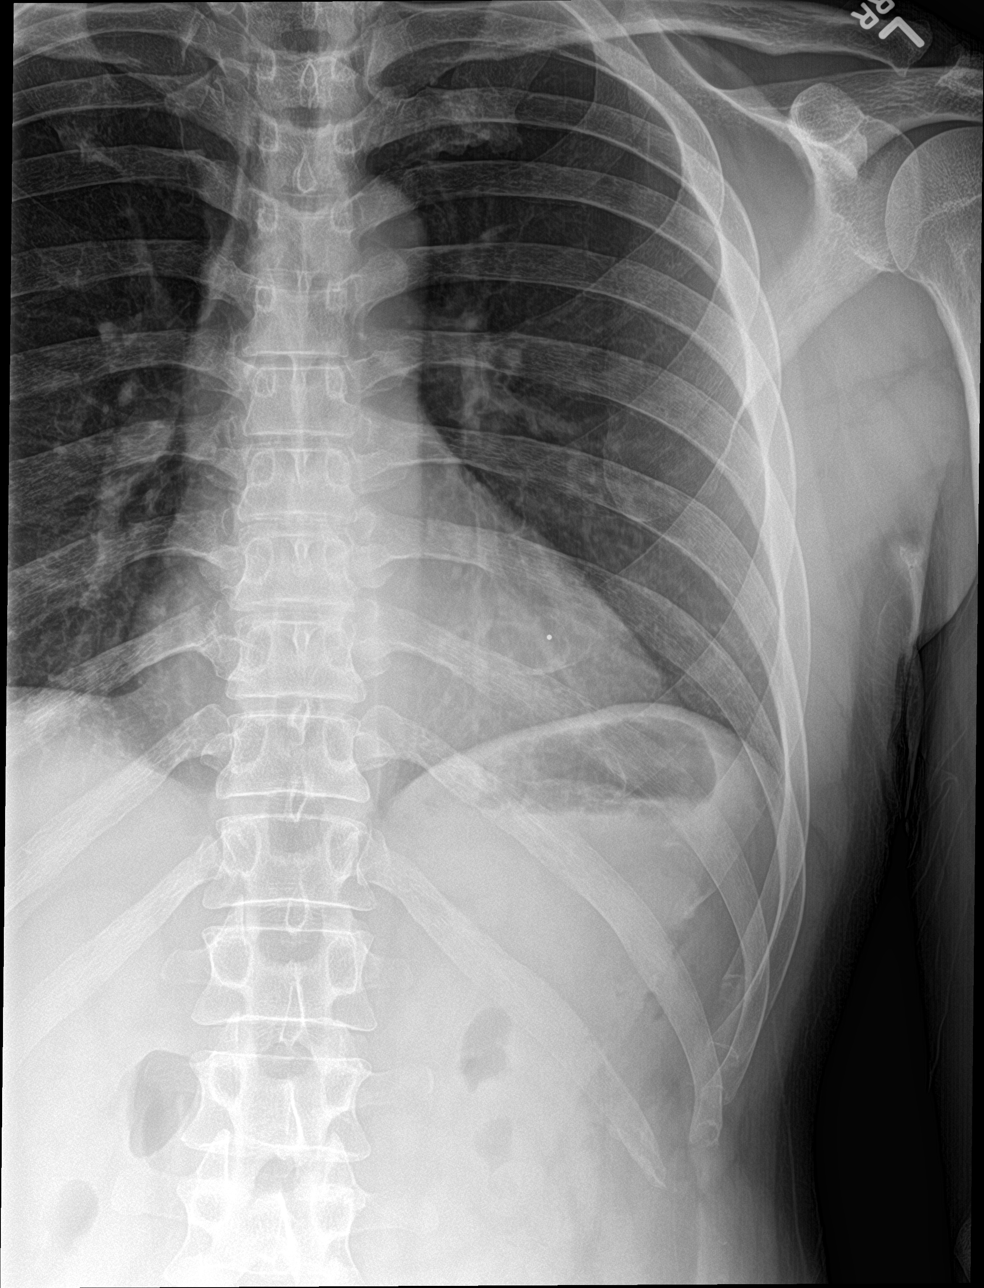

[rib ap obl]
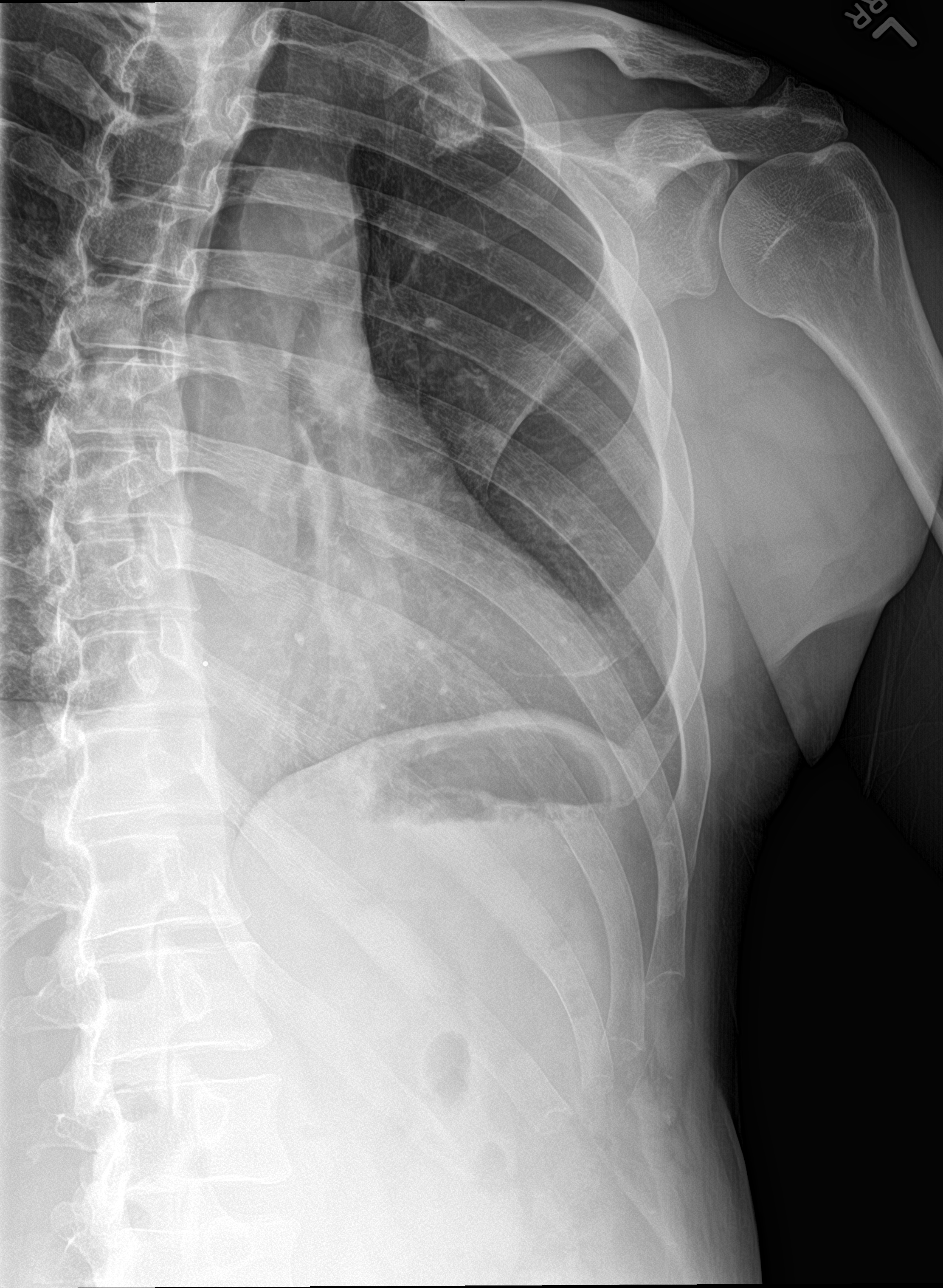

[3 of 3 positions shown; findings below may reference images not displayed]

FINDINGS: Lungs are clear.  No pleural effusion or pneumothorax.

The heart is normal in size.

No displaced left rib fracture is seen.
IMPRESSION: No evidence of acute cardiopulmonary disease.

No displaced left rib fracture is seen.

## 2023-05-23 ENCOUNTER — Other Ambulatory Visit: Payer: Self-pay

## 2023-05-23 ENCOUNTER — Encounter (HOSPITAL_BASED_OUTPATIENT_CLINIC_OR_DEPARTMENT_OTHER): Payer: Self-pay

## 2023-05-23 ENCOUNTER — Emergency Department (HOSPITAL_BASED_OUTPATIENT_CLINIC_OR_DEPARTMENT_OTHER)
Admission: EM | Admit: 2023-05-23 | Discharge: 2023-05-23 | Disposition: A | Discharge status: Home / Self Care | Payer: No Typology Code available for payment source | Attending: Emergency Medicine | Admitting: Emergency Medicine

## 2023-05-23 DIAGNOSIS — Z8547 Personal history of malignant neoplasm of testis: Secondary | ICD-10-CM | POA: Diagnosis not present

## 2023-05-23 DIAGNOSIS — K644 Residual hemorrhoidal skin tags: Secondary | ICD-10-CM | POA: Insufficient documentation

## 2023-05-23 DIAGNOSIS — K625 Hemorrhage of anus and rectum: Secondary | ICD-10-CM | POA: Diagnosis present

## 2023-05-23 LAB — CBC WITH DIFFERENTIAL/PLATELET
Abs Immature Granulocytes: 0.02 10*3/uL (ref 0.00–0.07)
Basophils Absolute: 0 10*3/uL (ref 0.0–0.1)
Basophils Relative: 0 %
Eosinophils Absolute: 0.1 10*3/uL (ref 0.0–0.5)
Eosinophils Relative: 1 %
HCT: 39.6 % (ref 39.0–52.0)
Hemoglobin: 13.8 g/dL (ref 13.0–17.0)
Immature Granulocytes: 0 %
Lymphocytes Relative: 30 %
Lymphs Abs: 2.1 10*3/uL (ref 0.7–4.0)
MCH: 33.3 pg (ref 26.0–34.0)
MCHC: 34.8 g/dL (ref 30.0–36.0)
MCV: 95.4 fL (ref 80.0–100.0)
Monocytes Absolute: 0.5 10*3/uL (ref 0.1–1.0)
Monocytes Relative: 8 %
Neutro Abs: 4.2 10*3/uL (ref 1.7–7.7)
Neutrophils Relative %: 61 %
Platelets: 156 10*3/uL (ref 150–400)
RBC: 4.15 MIL/uL — ABNORMAL LOW (ref 4.22–5.81)
RDW: 14.2 % (ref 11.5–15.5)
WBC: 7 10*3/uL (ref 4.0–10.5)
nRBC: 0 % (ref 0.0–0.2)

## 2023-05-23 LAB — OCCULT BLOOD X 1 CARD TO LAB, STOOL: Fecal Occult Bld: POSITIVE — AB

## 2023-05-23 NOTE — ED Provider Notes (Signed)
[place] [facility] AT [facility] Provider Note   CSN: [id number] Arrival date & time: 05/23/23  1147     History  Chief Complaint  Patient presents with   Rectal Bleeding    Jeffrey Freeman is a 52 y.o. male history of testicular cancer status post left testicle removal not currently on radiation or chemo presented with bright red blood per rectum for the past few weeks.  Patient states he has history of hemorrhoids and thinks this may be causing it.  Patient denies dark red stools or black stools or nausea vomiting, fevers, abdominal pain, weakness, lightheadedness, blood thinners or bleeding disorders.  Patient states his last colonoscopy was normal.  Home Medications Prior to Admission medications   Medication Sig Start Date End Date Taking? Authorizing Provider  naproxen (NAPROSYN) 500 MG tablet Take 1 tablet (500 mg total) by mouth 2 (two) times daily. 09/04/21   Pollyann Savoy, MD  senna-docusate (SENOKOT-S) 8.6-50 MG per tablet Take 2 tablets by mouth daily. 03/17/13   Gerhard Munch, MD      Allergies    Patient has no known allergies.    Review of Systems   Review of Systems  Physical Exam Updated Vital Signs BP (!) 143/79   Pulse (!) 52   Temp 97.8 F (36.6 C) (Oral)   Resp 18   Ht 6' (1.829 m)   Wt 87 kg   SpO2 97%   BMI 26.01 kg/m  Physical Exam Constitutional:      General: He is not in acute distress. Abdominal:     Palpations: Abdomen is soft.     Tenderness: There is no abdominal tenderness. There is no guarding or rebound.  Genitourinary:    Comments: Chaperone: Roughgarden, Jeffrey Nipper, RN External hemorrhoid noted with dried blood on it however not actively hemorrhaging No signs of strangulation or thrombosis Rectal tone intact No other rectal abnormalities noted Skin:    General: Skin is warm.     Capillary Refill: Capillary refill takes less than 2 seconds.     Coloration: Skin is not pale.  Neurological:      Mental Status: He is alert.     ED Results / Procedures / Treatments   Labs (all labs ordered are listed, but only abnormal results are displayed) Labs Reviewed  CBC WITH DIFFERENTIAL/PLATELET - Abnormal; Notable for the following components:      Result Value   RBC 4.15 (*)    All other components within normal limits  OCCULT BLOOD X 1 CARD TO LAB, STOOL - Abnormal; Notable for the following components:   Fecal Occult Bld POSITIVE (*)    All other components within normal limits    EKG None  Radiology No results found.  Procedures Procedures    Medications Ordered in ED Medications - No data to display  ED Course/ Medical Decision Making/ A&P                                 Medical Decision Making Amount and/or Complexity of Data Reviewed Labs: ordered.   Jeffrey Freeman 52 y.o. presented today for GIB. Working DDx that I considered at this time includes, but not limited to, Esophagitis, Mallory Weiss/Boerhaave, Variceal bleeding, PUD/gastritis/ulcers, diverticular bleed, colon cancer, rectal bleed, internal/external hemorrhoids  R/o DDx: Esophagitis, Mallory Weiss/Boerhaave, Variceal bleeding, PUD/gastritis/ulcers, diverticular bleed, colon cancer, rectal bleed, internal: These are considered less likely due to  history of present illness, physical exam, labs/imaging findings  Review of prior external notes: 01/29/2023 unknown  Unique Tests and My Interpretation:  Fecal occult test: Positive CBC: Unremarkable  Discussion with Independent Historian:  None  Discussion of Management of Tests: None  Risk: Low: based on diagnostic testing/clinical impression and treatment plan  Risk Stratification Score: None  Plan: On exam patient was no acute distress stable vitals.  With a chaperone a rectal exam was conducted that does show external hemorrhoid or I can see signs of where has been bleeding from however not actively hemorrhaging.  Hemorrhoid does not show signs  of thrombosis.  Patient states that the symptoms are going on for few weeks and so will obtain CBC to check blood count however patient is not showing any signs of anemia clinically and is on blood thinners.  Anticipate discharge with general surgery follow-up.  Home care discussed including sitz baths 15 min TID.  Discharge with stool softener and recommendation for surgery follow up. Pt with normal vitals and in no acute distress prior to dc.  Patient was given return precautions. Patient stable for discharge at this time.  Patient verbalized understanding of plan.  This chart was dictated using voice recognition software.  Despite best efforts to proofread,  errors can occur which can change the documentation meaning.         Final Clinical Impression(s) / ED Diagnoses Final diagnoses:  External hemorrhoid    Rx / DC Orders ED Discharge Orders     None         Jeffrey Freeman 05/23/23 1331    Long, Arlyss Repress, MD 06/02/23 1616

## 2023-05-23 NOTE — Discharge Instructions (Signed)
Please follow-up with the general surgeon have attached you for your history since your visit.  Today exam shows you have a hemorrhoid causing your symptoms and your labs are reassuring.  You may use sitz bath's 3 times daily and eat a high-fiber diet and remain hydrated.  May also use stool softener such as MiraLAX over-the-counter for your symptoms.  If symptoms change or worsen please return to the ER.

## 2023-05-23 NOTE — ED Triage Notes (Signed)
The patient has had on and off bright red bleeding from his rectum for one week. Today its worse. No abd pain.

## 2024-06-28 ENCOUNTER — Emergency Department (HOSPITAL_BASED_OUTPATIENT_CLINIC_OR_DEPARTMENT_OTHER)
Admission: EM | Admit: 2024-06-28 | Discharge: 2024-06-28 | Disposition: A | Discharge status: Home / Self Care | Attending: Emergency Medicine | Admitting: Emergency Medicine

## 2024-06-28 ENCOUNTER — Encounter (HOSPITAL_BASED_OUTPATIENT_CLINIC_OR_DEPARTMENT_OTHER): Payer: Self-pay | Admitting: Emergency Medicine

## 2024-06-28 ENCOUNTER — Emergency Department (HOSPITAL_BASED_OUTPATIENT_CLINIC_OR_DEPARTMENT_OTHER)

## 2024-06-28 ENCOUNTER — Other Ambulatory Visit: Payer: Self-pay

## 2024-06-28 DIAGNOSIS — Z8547 Personal history of malignant neoplasm of testis: Secondary | ICD-10-CM | POA: Diagnosis not present

## 2024-06-28 DIAGNOSIS — M542 Cervicalgia: Secondary | ICD-10-CM | POA: Insufficient documentation

## 2024-06-28 DIAGNOSIS — M25511 Pain in right shoulder: Secondary | ICD-10-CM | POA: Insufficient documentation

## 2024-06-28 DIAGNOSIS — X501XXA Overexertion from prolonged static or awkward postures, initial encounter: Secondary | ICD-10-CM | POA: Insufficient documentation

## 2024-06-28 LAB — CBC
HCT: 44.5 % (ref 39.0–52.0)
Hemoglobin: 15 g/dL (ref 13.0–17.0)
MCH: 31.4 pg (ref 26.0–34.0)
MCHC: 33.7 g/dL (ref 30.0–36.0)
MCV: 93.1 fL (ref 80.0–100.0)
Platelets: 142 K/uL — ABNORMAL LOW (ref 150–400)
RBC: 4.78 MIL/uL (ref 4.22–5.81)
RDW: 14.8 % (ref 11.5–15.5)
WBC: 6.8 K/uL (ref 4.0–10.5)
nRBC: 0 % (ref 0.0–0.2)

## 2024-06-28 LAB — BASIC METABOLIC PANEL WITH GFR
Anion gap: 11 (ref 5–15)
BUN: 12 mg/dL (ref 6–20)
CO2: 22 mmol/L (ref 22–32)
Calcium: 9.2 mg/dL (ref 8.9–10.3)
Chloride: 103 mmol/L (ref 98–111)
Creatinine, Ser: 1.36 mg/dL — ABNORMAL HIGH (ref 0.61–1.24)
GFR, Estimated: >60 mL/min (ref >60)
Glucose, Bld: 114 mg/dL — ABNORMAL HIGH (ref 70–99)
Potassium: 4.4 mmol/L (ref 3.5–5.1)
Sodium: 137 mmol/L (ref 135–145)

## 2024-06-28 LAB — TROPONIN T, HIGH SENSITIVITY: Troponin T High Sensitivity: <15 ng/L (ref 0–19)

## 2024-06-28 MED ORDER — METHOCARBAMOL 500 MG PO TABS: 500.0000 mg | ORAL_TABLET | Freq: Two times a day (BID) | ORAL | 0 refills | Status: AC

## 2024-06-28 MED ORDER — LIDOCAINE 5 % EX PTCH: 1.0000 | MEDICATED_PATCH | CUTANEOUS | 0 refills | Status: AC

## 2024-06-28 NOTE — ED Triage Notes (Signed)
 Pt c/o R shoulder pain radiating to back x 1 day, pain worse with deep inspiration/cough.  +smoker

## 2024-06-28 NOTE — ED Provider Notes (Signed)
 [place] [facility] AT [facility] Provider Note   CSN: [id number] Arrival date & time: 06/28/24  1142     Patient presents with: Shoulder Pain   Jeffrey Freeman is a 53 y.o. male.   Shoulder Pain Associated symptoms: back pain   Patient is a 53 year old male presenting ED today for concerns for right shoulder pain that been present x 1 day that started acutely after trying to pop his neck where he then felt like he pulled a muscle and has since had progressive pain and aching from right side of his neck to right shoulder.  Notes that his right neck pain has improved but has had increased pain around right shoulder.  Describes pain as ache.  Notably worse with movement as well as pain with big inspirations.   Previous medical history of testicular cancer.  Denies fever, headache, vision changes, chest pain, shortness of breath, abdominal pain, nausea, vomiting, diarrhea, dysuria, hematuria, lower leg swelling.     Prior to Admission medications   Medication Sig Start Date End Date Taking? Authorizing Provider  lidocaine  (LIDODERM ) 5 % Place 1 patch onto the skin daily. Remove & Discard patch within 12 hours or as directed by MD 06/28/24  Yes Beola, Terrall RAMAN, PA-C  methocarbamol (ROBAXIN) 500 MG tablet Take 1 tablet (500 mg total) by mouth 2 (two) times daily. 06/28/24  Yes Beola Terrall RAMAN, PA-C  naproxen  (NAPROSYN ) 500 MG tablet Take 1 tablet (500 mg total) by mouth 2 (two) times daily. 09/04/21   Roselyn Carlin NOVAK, MD  senna-docusate (SENOKOT-S) 8.6-50 MG per tablet Take 2 tablets by mouth daily. 03/17/13   Garrick Charleston, MD    Allergies: Patient has no known allergies.    Review of Systems  Musculoskeletal:  Positive for back pain.  All other systems reviewed and are negative.   Updated Vital Signs BP 132/86   Pulse (!) 49   Temp 97.8 F (36.6 C) (Oral)   Resp 15   Ht 6' (1.829 m)   Wt 93.4 kg   SpO2 100%   BMI 27.94 kg/m   Physical  Exam Vitals and nursing note reviewed.  Constitutional:      General: He is not in acute distress.    Appearance: Normal appearance. He is not ill-appearing or diaphoretic.  HENT:     Head: Normocephalic and atraumatic.  Eyes:     General: No scleral icterus.       Right eye: No discharge.        Left eye: No discharge.     Extraocular Movements: Extraocular movements intact.     Conjunctiva/sclera: Conjunctivae normal.  Cardiovascular:     Rate and Rhythm: Normal rate and regular rhythm.     Pulses: Normal pulses.     Heart sounds: Normal heart sounds. No murmur heard.    No friction rub. No gallop.  Pulmonary:     Effort: Pulmonary effort is normal. No respiratory distress.     Breath sounds: No stridor. No wheezing, rhonchi or rales.  Chest:     Chest wall: No tenderness.  Abdominal:     General: Abdomen is flat. There is no distension.     Palpations: Abdomen is soft.     Tenderness: There is no abdominal tenderness. There is no right CVA tenderness, left CVA tenderness, guarding or rebound.  Musculoskeletal:        General: Tenderness (Notably has point tenderness around the sets of muscle of right scapula.) present.  No swelling, deformity or signs of injury.     Cervical back: Normal range of motion. Tenderness (Notably has some mild point tenderness to paracervical spinal muscles without any midline tenderness) present. No rigidity.     Right lower leg: No edema.     Left lower leg: No edema.  Skin:    General: Skin is warm and dry.     Findings: No bruising, erythema or lesion.  Neurological:     General: No focal deficit present.     Mental Status: He is alert and oriented to person, place, and time. Mental status is at baseline.     Sensory: No sensory deficit.     Motor: No weakness.  Psychiatric:        Mood and Affect: Mood normal.     (all labs ordered are listed, but only abnormal results are displayed) Labs Reviewed  BASIC METABOLIC PANEL WITH GFR -  Abnormal; Notable for the following components:      Result Value   Glucose, Bld 114 (*)    Creatinine, Ser 1.36 (*)    All other components within normal limits  CBC - Abnormal; Notable for the following components:   Platelets 142 (*)    All other components within normal limits  TROPONIN T, HIGH SENSITIVITY    EKG: None  Radiology: DG Chest 2 View Result Date: 06/28/2024 CLINICAL DATA:  Right shoulder pain. EXAM: CHEST - 2 VIEW; RIGHT SHOULDER - 2+ VIEW COMPARISON:  Chest radiograph dated 09/04/2021. FINDINGS: No focal consolidation, pleural effusion, or pneumothorax. The cardiac silhouette is within normal limits. No acute fracture or dislocation of the right shoulder. The bones are well mineralized. Minimal irregularity of the bony glenoid and mild arthritic changes of the right AC joint. The soft tissues are unremarkable. IMPRESSION: 1. No acute cardiopulmonary process. 2. No acute fracture or dislocation of the right shoulder. Electronically Signed   By: Vanetta Chou M.D.   On: 06/28/2024 13:09   DG Shoulder Right Result Date: 06/28/2024 CLINICAL DATA:  Right shoulder pain. EXAM: CHEST - 2 VIEW; RIGHT SHOULDER - 2+ VIEW COMPARISON:  Chest radiograph dated 09/04/2021. FINDINGS: No focal consolidation, pleural effusion, or pneumothorax. The cardiac silhouette is within normal limits. No acute fracture or dislocation of the right shoulder. The bones are well mineralized. Minimal irregularity of the bony glenoid and mild arthritic changes of the right AC joint. The soft tissues are unremarkable. IMPRESSION: 1. No acute cardiopulmonary process. 2. No acute fracture or dislocation of the right shoulder. Electronically Signed   By: Vanetta Chou M.D.   On: 06/28/2024 13:09     Procedures   Medications Ordered in the ED - No data to display    Medical Decision Making Amount and/or Complexity of Data Reviewed Labs: ordered. Radiology: ordered.   This patient is a 53 year old  male who presents to the ED for concern of right shoulder and right para scapular pain that happened acutely after trying to crack his neck.  Denies numbness, wheeze, tingling, notably does have aching sensations.  On physical exam, patient is in no acute distress, afebrile, alert and orient x 4, speaking in full sentences, nontachypneic, nontachycardic.  Patient has good ROM in both shoulders however does have notably increase in pain when stretching of the parascapular muscles.  Good strength bilaterally.  Neurovascularly intact.  Notably has point tenderness over paraspinal muscles of cervical spine without any midline tenderness.  Unremarkable exam otherwise.  Lab work imaging were unremarkable and reassuring/at  baseline.  Low suspicion for any emergent cause of symptoms today including low suspicion for ACS, PE, cholecystitis.  Suspecting likely muscle strain and will send home medications to help symptomatically.  Will also have him continue to follow-up with PCP for further evaluation and management.  Patient vital signs have remained stable throughout the course of patient's time in the ED. Low suspicion for any other emergent pathology at this time. I believe this patient is safe to be discharged. Provided strict return to ER precautions. Patient expressed agreement and understanding of plan. All questions were answered.  Differential diagnoses prior to evaluation: The emergent differential diagnosis includes, but is not limited to, radiculopathy, myopathy, muscle strain, ACS, PE, AAS, costochondritis, fracture, cholecystitis. This is not an exhaustive differential.   Past Medical History / Co-morbidities / Social History: Testicular cancer  Additional history: Chart reviewed. 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  Plan to have CTAP locally  Lab Tests/Imaging studies: I personally interpreted  labs/imaging and the pertinent results include:   CBC shows mildly low platelets of 142, near baseline BMP shows baseline creatinine otherwise unremarkable Troponin unremarkable Chest x-ray and shoulder x-ray unremarkable.   I agree with the radiologist interpretation.      Medications: I ordered medication including Robaxin, lidocaine  patch.  I have reviewed the patients home medicines and have made adjustments as needed.  Critical Interventions: None  Social Determinants of Health: Patient has PCP but has not seen him in some time, is planning scheduling an appointment  Disposition: After consideration of the diagnostic results and the patients response to treatment, I feel that the patient would benefit from discharge and treatment as above.   emergency department workup does not suggest an emergent condition requiring admission or immediate intervention beyond what has been performed at this time. The plan is: Symptomatic management at home, follow-up PCP, return to the ER for new or worsening symptoms. The patient is safe for discharge and has been instructed to return immediately for worsening symptoms, change in symptoms or any other concerns.   Final diagnoses:  Acute pain of right shoulder    ED Discharge Orders          Ordered    lidocaine  (LIDODERM ) 5 %  Every 24 hours        06/28/24 1530    methocarbamol (ROBAXIN) 500 MG tablet  2 times daily        06/28/24 1530               Beola Derry [place], PA-C 06/28/24 1533    Long, Joshua G, MD 06/29/24 1026

## 2024-06-28 NOTE — Discharge Instructions (Addendum)
 You are seen today for right shoulder strain.  Your lab work, imaging and physical exam today were very reassuring that low suspicion for any emergent cause of your symptoms today.  However I am sending in a muscle relaxer for you to use as needed.  As well as lidocaine  patches you can also use over-the-counter lidocaine  patches if the prescription ones are too expensive.  Additionally you can use Tylenol as needed.  Take Tylenol (acetominophen)  650mg  every 4-6 hours, as needed for pain or fever. Do not take more than 4,000 mg in a 24-hour period. As this may cause liver damage. While this is rare, if you begin to develop yellowing of the skin or eyes, stop taking and return to ER immediately.  Please take Robaxin, 500 mg up to twice a day as needed for muscle spasm, this is a muscle relaxer, it may cause generalized weakness, sleepiness and you should not drive or do important things while taking this medication.  Recommend continue to follow-up with your PCP for further monitoring and management of your symptoms.  Return to the ER though if continued new or worsening symptoms
